# Patient Record
Sex: Female | Born: 1990 | Race: White | Hispanic: No | Marital: Single | Smoking: Never smoker
Health system: Southern US, Community
[De-identification: ages and names within clinical notes are randomized; demographics above are authoritative.]

---

## 2015-12-02 ENCOUNTER — Inpatient Hospital Stay (HOSPITAL_COMMUNITY)
Admission: EM | Admit: 2015-12-02 | Discharge: 2015-12-04 | DRG: 552 | Disposition: A | Discharge status: Home Health | Payer: Self-pay | Attending: General Surgery | Admitting: General Surgery

## 2015-12-02 ENCOUNTER — Emergency Department (HOSPITAL_COMMUNITY): Payer: Self-pay

## 2015-12-02 ENCOUNTER — Encounter (HOSPITAL_COMMUNITY): Payer: Self-pay | Admitting: Emergency Medicine

## 2015-12-02 DIAGNOSIS — R110 Nausea: Secondary | ICD-10-CM | POA: Diagnosis present

## 2015-12-02 DIAGNOSIS — S22009A Unspecified fracture of unspecified thoracic vertebra, initial encounter for closed fracture: Secondary | ICD-10-CM | POA: Diagnosis present

## 2015-12-02 DIAGNOSIS — R519 Headache, unspecified: Secondary | ICD-10-CM | POA: Diagnosis present

## 2015-12-02 DIAGNOSIS — S22061A Stable burst fracture of T7-T8 vertebra, initial encounter for closed fracture: Principal | ICD-10-CM | POA: Diagnosis present

## 2015-12-02 DIAGNOSIS — S22059A Unspecified fracture of T5-T6 vertebra, initial encounter for closed fracture: Secondary | ICD-10-CM | POA: Diagnosis present

## 2015-12-02 DIAGNOSIS — R51 Headache: Secondary | ICD-10-CM

## 2015-12-02 DIAGNOSIS — S22000A Wedge compression fracture of unspecified thoracic vertebra, initial encounter for closed fracture: Secondary | ICD-10-CM

## 2015-12-02 DIAGNOSIS — F410 Panic disorder [episodic paroxysmal anxiety] without agoraphobia: Secondary | ICD-10-CM | POA: Diagnosis present

## 2015-12-02 LAB — COMPREHENSIVE METABOLIC PANEL
ALT: 40 U/L (ref 14–54)
AST: 68 U/L — AB (ref 15–41)
Albumin: 3.6 g/dL (ref 3.5–5.0)
Alkaline Phosphatase: 51 U/L (ref 38–126)
Anion gap: 9 (ref 5–15)
BILIRUBIN TOTAL: 1.1 mg/dL (ref 0.3–1.2)
BUN: 18 mg/dL (ref 6–20)
CO2: 22 mmol/L (ref 22–32)
CREATININE: 0.9 mg/dL (ref 0.44–1.00)
Calcium: 9.4 mg/dL (ref 8.9–10.3)
Chloride: 108 mmol/L (ref 101–111)
Glucose, Bld: 98 mg/dL (ref 65–99)
POTASSIUM: 3.8 mmol/L (ref 3.5–5.1)
Sodium: 139 mmol/L (ref 135–145)
TOTAL PROTEIN: 6.2 g/dL — AB (ref 6.5–8.1)

## 2015-12-02 LAB — POC URINE PREG, ED: Preg Test, Ur: NEGATIVE

## 2015-12-02 LAB — URINALYSIS, ROUTINE W REFLEX MICROSCOPIC
Bilirubin Urine: NEGATIVE
GLUCOSE, UA: NEGATIVE mg/dL
KETONES UR: NEGATIVE mg/dL
LEUKOCYTES UA: NEGATIVE
Nitrite: NEGATIVE
PROTEIN: NEGATIVE mg/dL
Specific Gravity, Urine: 1.013 (ref 1.005–1.030)
pH: 5.5 (ref 5.0–8.0)

## 2015-12-02 LAB — CBC WITH DIFFERENTIAL/PLATELET
BASOS ABS: 0 10*3/uL (ref 0.0–0.1)
Basophils Relative: 0 %
EOS PCT: 1 %
Eosinophils Absolute: 0.1 10*3/uL (ref 0.0–0.7)
HEMATOCRIT: 37.8 % (ref 36.0–46.0)
Hemoglobin: 12.5 g/dL (ref 12.0–15.0)
LYMPHS ABS: 2.2 10*3/uL (ref 0.7–4.0)
LYMPHS PCT: 15 %
MCH: 31.4 pg (ref 26.0–34.0)
MCHC: 33.1 g/dL (ref 30.0–36.0)
MCV: 95 fL (ref 78.0–100.0)
MONO ABS: 0.8 10*3/uL (ref 0.1–1.0)
MONOS PCT: 6 %
NEUTROS ABS: 11.2 10*3/uL — AB (ref 1.7–7.7)
Neutrophils Relative %: 78 %
Platelets: 268 10*3/uL (ref 150–400)
RBC: 3.98 MIL/uL (ref 3.87–5.11)
RDW: 12.2 % (ref 11.5–15.5)
WBC: 14.4 10*3/uL — ABNORMAL HIGH (ref 4.0–10.5)

## 2015-12-02 LAB — LIPASE, BLOOD: LIPASE: 45 U/L (ref 11–51)

## 2015-12-02 LAB — URINE MICROSCOPIC-ADD ON

## 2015-12-02 MED ORDER — ONDANSETRON HCL 4 MG/2ML IJ SOLN
4.0000 mg | Freq: Four times a day (QID) | INTRAMUSCULAR | Status: DC | PRN
  Administered 2015-12-02: 4 mg via INTRAVENOUS
  Filled 2015-12-02: qty 2

## 2015-12-02 MED ORDER — METHOCARBAMOL 1000 MG/10ML IJ SOLN
1000.0000 mg | Freq: Three times a day (TID) | INTRAMUSCULAR | Status: DC
  Administered 2015-12-02 – 2015-12-03 (×3): 1000 mg via INTRAVENOUS
  Filled 2015-12-02 (×5): qty 10

## 2015-12-02 MED ORDER — SODIUM CHLORIDE 0.9 % IV BOLUS (SEPSIS)
1000.0000 mL | Freq: Once | INTRAVENOUS | Status: AC
  Administered 2015-12-02: 1000 mL via INTRAVENOUS

## 2015-12-02 MED ORDER — FENTANYL CITRATE (PF) 100 MCG/2ML IJ SOLN
100.0000 ug | Freq: Once | INTRAMUSCULAR | Status: AC
  Administered 2015-12-02: 100 ug via INTRAVENOUS
  Filled 2015-12-02: qty 2

## 2015-12-02 MED ORDER — HYDROMORPHONE HCL 1 MG/ML IJ SOLN: 1.0000 mg | INTRAMUSCULAR | Status: DC | PRN

## 2015-12-02 MED ORDER — ENOXAPARIN SODIUM 30 MG/0.3ML ~~LOC~~ SOLN
30.0000 mg | Freq: Two times a day (BID) | SUBCUTANEOUS | Status: DC
  Administered 2015-12-02 – 2015-12-04 (×5): 30 mg via SUBCUTANEOUS
  Filled 2015-12-02 (×5): qty 0.3

## 2015-12-02 MED ORDER — DOCUSATE SODIUM 100 MG PO CAPS
100.0000 mg | ORAL_CAPSULE | Freq: Two times a day (BID) | ORAL | Status: DC
  Administered 2015-12-03: 100 mg via ORAL
  Filled 2015-12-02 (×3): qty 1

## 2015-12-02 MED ORDER — HYDROMORPHONE HCL 1 MG/ML IJ SOLN
1.0000 mg | INTRAMUSCULAR | Status: DC | PRN
Start: 2015-12-02 — End: 2015-12-02
  Administered 2015-12-02 (×2): 1 mg via INTRAVENOUS
  Filled 2015-12-02 (×2): qty 1

## 2015-12-02 MED ORDER — HYDROMORPHONE HCL 1 MG/ML IJ SOLN
2.0000 mg | Freq: Once | INTRAMUSCULAR | Status: AC
  Administered 2015-12-02: 2 mg via INTRAVENOUS
  Filled 2015-12-02: qty 2

## 2015-12-02 MED ORDER — IOHEXOL 300 MG/ML  SOLN
100.0000 mL | Freq: Once | INTRAMUSCULAR | Status: AC | PRN
  Administered 2015-12-02: 100 mL via INTRAVENOUS

## 2015-12-02 MED ORDER — OXYCODONE HCL 5 MG PO TABS: 5.0000 mg | ORAL_TABLET | ORAL | Status: DC | PRN

## 2015-12-02 MED ORDER — ONDANSETRON HCL 4 MG PO TABS
4.0000 mg | ORAL_TABLET | Freq: Four times a day (QID) | ORAL | Status: DC | PRN
  Administered 2015-12-03: 4 mg via ORAL
  Filled 2015-12-02: qty 1

## 2015-12-02 MED ORDER — SODIUM CHLORIDE 0.9 % IV SOLN
INTRAVENOUS | Status: DC
  Administered 2015-12-02 (×2): via INTRAVENOUS

## 2015-12-02 MED ORDER — OXYCODONE HCL 5 MG PO TABS
10.0000 mg | ORAL_TABLET | ORAL | Status: DC | PRN
  Administered 2015-12-02 – 2015-12-03 (×4): 10 mg via ORAL
  Filled 2015-12-02 (×4): qty 2

## 2015-12-02 MED ORDER — PROMETHAZINE HCL 25 MG/ML IJ SOLN
12.5000 mg | Freq: Four times a day (QID) | INTRAMUSCULAR | Status: DC | PRN
  Administered 2015-12-02: 12.5 mg via INTRAVENOUS
  Filled 2015-12-02: qty 1

## 2015-12-02 MED ORDER — BISACODYL 10 MG RE SUPP: 10.0000 mg | Freq: Every day | RECTAL | Status: DC | PRN

## 2015-12-02 MED ORDER — MORPHINE SULFATE (PF) 2 MG/ML IV SOLN
1.0000 mg | INTRAVENOUS | Status: DC | PRN
  Administered 2015-12-02 – 2015-12-03 (×2): 1 mg via INTRAVENOUS
  Filled 2015-12-02 (×2): qty 1

## 2015-12-02 MED ORDER — ACETAMINOPHEN 500 MG PO TABS
1000.0000 mg | ORAL_TABLET | Freq: Four times a day (QID) | ORAL | Status: DC | PRN
  Administered 2015-12-03: 1000 mg via ORAL
  Filled 2015-12-02: qty 2

## 2015-12-02 MED ORDER — HYDROMORPHONE HCL 1 MG/ML IJ SOLN
1.0000 mg | Freq: Once | INTRAMUSCULAR | Status: AC
  Administered 2015-12-02: 1 mg via INTRAVENOUS
  Filled 2015-12-02: qty 1

## 2015-12-02 MED ORDER — TETANUS-DIPHTH-ACELL PERTUSSIS 5-2.5-18.5 LF-MCG/0.5 IM SUSP
0.5000 mL | Freq: Once | INTRAMUSCULAR | Status: AC
  Administered 2015-12-02: 0.5 mL via INTRAMUSCULAR
  Filled 2015-12-02: qty 0.5

## 2015-12-02 MED ORDER — METHOCARBAMOL 500 MG PO TABS: 500.0000 mg | ORAL_TABLET | Freq: Three times a day (TID) | ORAL | Status: DC | PRN

## 2015-12-02 MED ORDER — LORAZEPAM 2 MG/ML IJ SOLN
1.0000 mg | Freq: Once | INTRAMUSCULAR | Status: DC
  Filled 2015-12-02: qty 1
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
## 2015-12-02 NOTE — ED Notes (Signed)
Phlebotomy at the bedside  
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
## 2015-12-02 NOTE — ED Notes (Signed)
Called radiology to report patient result for urine preg is back, ready for transport.
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
## 2015-12-02 NOTE — Progress Notes (Signed)
Orthopedic Tech Progress Note Patient Details:  Sydney PeruJodie Mcelveen 09/18/1991 [id number]  Patient ID: Sydney Oneill, female   DOB: 12/02/1990, 25 y.o.   MRN: [id number] Called in bio-tech brace order; spoke with Anderson MaltaStephanie  Carigan Lister 12/02/2015, 9:11 AM

## 2015-12-02 NOTE — ED Notes (Signed)
Technician coming to place TLSO brace, but needing to go get another size.

## 2015-12-02 NOTE — ED Notes (Signed)
Spoke with ortho about TLSO brace, reports he will inquire about it at 9 am when biomed opens.

## 2015-12-02 NOTE — ED Notes (Signed)
Called Dr. Mora Bellmanni in regards to anxiety and nausea. MD allows for 1mg  of ativan.

## 2015-12-02 NOTE — ED Notes (Signed)
Patient is in ct 
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
## 2015-12-03 LAB — BASIC METABOLIC PANEL
ANION GAP: 7 (ref 5–15)
BUN: 8 mg/dL (ref 6–20)
CALCIUM: 8.6 mg/dL — AB (ref 8.9–10.3)
CO2: 24 mmol/L (ref 22–32)
CREATININE: 0.84 mg/dL (ref 0.44–1.00)
Chloride: 109 mmol/L (ref 101–111)
Glucose, Bld: 85 mg/dL (ref 65–99)
Potassium: 3.4 mmol/L — ABNORMAL LOW (ref 3.5–5.1)
SODIUM: 140 mmol/L (ref 135–145)

## 2015-12-03 LAB — CBC
HCT: 35.7 % — ABNORMAL LOW (ref 36.0–46.0)
Hemoglobin: 12 g/dL (ref 12.0–15.0)
MCH: 32.1 pg (ref 26.0–34.0)
MCHC: 33.6 g/dL (ref 30.0–36.0)
MCV: 95.5 fL (ref 78.0–100.0)
PLATELETS: 212 10*3/uL (ref 150–400)
RBC: 3.74 MIL/uL — ABNORMAL LOW (ref 3.87–5.11)
RDW: 12.5 % (ref 11.5–15.5)
WBC: 6.2 10*3/uL (ref 4.0–10.5)

## 2015-12-03 MED ORDER — MORPHINE SULFATE (PF) 2 MG/ML IV SOLN
1.0000 mg | INTRAVENOUS | Status: DC | PRN
  Administered 2015-12-03 – 2015-12-04 (×3): 2 mg via INTRAVENOUS
  Filled 2015-12-03 (×3): qty 1

## 2015-12-03 MED ORDER — METHOCARBAMOL 500 MG PO TABS
1000.0000 mg | ORAL_TABLET | Freq: Three times a day (TID) | ORAL | Status: DC | PRN
Start: 2015-12-03 — End: 2015-12-04
  Administered 2015-12-04: 1000 mg via ORAL
  Filled 2015-12-03: qty 2

## 2015-12-03 MED ORDER — OXYCODONE HCL 5 MG PO TABS
5.0000 mg | ORAL_TABLET | ORAL | Status: DC | PRN
  Administered 2015-12-03: 10 mg via ORAL
  Administered 2015-12-03: 15 mg via ORAL
  Administered 2015-12-03: 5 mg via ORAL
  Administered 2015-12-04 (×2): 10 mg via ORAL
  Administered 2015-12-04 (×2): 15 mg via ORAL
  Filled 2015-12-03 (×2): qty 2
  Filled 2015-12-03: qty 3
  Filled 2015-12-03: qty 1
  Filled 2015-12-03 (×2): qty 2
  Filled 2015-12-03: qty 3
  Filled 2015-12-03: qty 2
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
## 2015-12-03 NOTE — Plan of Care (Signed)
Problem: Acute Rehab PT Goals(only PT should resolve) Goal: Pt Will Go Supine/Side To Sit With correct body mechanics     
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
## 2015-12-04 DIAGNOSIS — R519 Headache, unspecified: Secondary | ICD-10-CM | POA: Diagnosis present

## 2015-12-04 DIAGNOSIS — R51 Headache: Secondary | ICD-10-CM

## 2015-12-04 DIAGNOSIS — R110 Nausea: Secondary | ICD-10-CM | POA: Diagnosis present

## 2015-12-04 MED ORDER — DIAZEPAM 5 MG PO TABS
5.0000 mg | ORAL_TABLET | Freq: Three times a day (TID) | ORAL | Status: DC | PRN
  Administered 2015-12-04: 5 mg via ORAL
  Filled 2015-12-04: qty 1

## 2015-12-04 MED ORDER — ACETAMINOPHEN 500 MG PO TABS: 1000.0000 mg | ORAL_TABLET | Freq: Four times a day (QID) | ORAL | Status: AC | PRN

## 2015-12-04 MED ORDER — TRAMADOL HCL 50 MG PO TABS: 50.0000 mg | ORAL_TABLET | Freq: Four times a day (QID) | ORAL | Status: AC | PRN

## 2015-12-04 MED ORDER — NAPROXEN 250 MG PO TABS: 500.0000 mg | ORAL_TABLET | Freq: Two times a day (BID) | ORAL | Status: DC

## 2015-12-04 MED ORDER — BISACODYL 10 MG RE SUPP: 10.0000 mg | Freq: Every day | RECTAL | Status: AC | PRN

## 2015-12-04 MED ORDER — DOCUSATE SODIUM 100 MG PO CAPS: 100.0000 mg | ORAL_CAPSULE | Freq: Two times a day (BID) | ORAL | Status: AC

## 2015-12-04 MED ORDER — TRAMADOL HCL 50 MG PO TABS
50.0000 mg | ORAL_TABLET | Freq: Four times a day (QID) | ORAL | Status: DC | PRN
  Administered 2015-12-04: 50 mg via ORAL
  Filled 2015-12-04: qty 1

## 2015-12-04 MED ORDER — DIAZEPAM 5 MG PO TABS: 5.0000 mg | ORAL_TABLET | Freq: Three times a day (TID) | ORAL | Status: DC | PRN

## 2015-12-04 MED ORDER — NAPROXEN 500 MG PO TABS: 500.0000 mg | ORAL_TABLET | Freq: Two times a day (BID) | ORAL | Status: AC

## 2015-12-04 MED ORDER — OXYCODONE HCL 5 MG PO TABS: 5.0000 mg | ORAL_TABLET | Freq: Four times a day (QID) | ORAL | Status: AC | PRN

## 2015-12-04 MED ORDER — DIAZEPAM 5 MG PO TABS: 5.0000 mg | ORAL_TABLET | Freq: Three times a day (TID) | ORAL | Status: AC | PRN
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
## 2015-12-04 NOTE — Discharge Instructions (Signed)
Thoracic Vertebral Fracture A vertebral fracture is a break in one of the bones that make up the spine (vertebrae). The vertebrae are stacked on top of each other to form the spinal column. They support the body and protect the spinal cord. The vertebral column has an upper part (cervical spine), a middle part (thoracic spine), and a lower part (lumbar spine). Most vertebral fractures occur in the thoracic spine or lumbar spine. There are three main types of vertebral fractures:  Flexion fracture. This happens when vertebrae collapse. Vertebrae can collapse:  In the front (compression fracture). This type of fracture is common in people who have a condition that causes their bones to be weak and brittle (osteoporosis). The fracture can make a person lose height.  In the front and back (axial burst fracture).  Extension fracture. This happens when an external force pulls apart the vertebrae.  Rotation fracture. This happens when the spine bends extremely in one direction. This type can cause a piece of a vertebra to break off (transverse process fracture) or move out of its normal position (fracture dislocation). This type of fracture has a high risk for spinal cord injury. Vertebral fractures can range from mild to very severe. The most severe types are those that cause the broken bones to move out of place (unstable) and those that injure or press on the spinal cord. CAUSES This condition is usually caused by a forceful injury. This type of injury commonly results from:  Car accidents.  Falling or jumping from a great height.  Collisions in contact sports.  Violent acts, such as an assault or a gunshot wound. RISK FACTORS This injury is more likely to happen to people who:  Have osteoporosis.  Participate in contact sports.  Are in situations that could result in falls or other violent injuries. SYMPTOMS Symptoms of this injury depend on the location and the type of fracture. The  most common symptom is back pain that gets worse with movement. You may also have trouble standing or walking. If a fracture has damaged your spinal cord or is pressing on it, you may also have:  Numbness.  Tingling.  Weakness.  Loss of movement.  Loss of bowel or bladder control. DIAGNOSIS This injury may be diagnosed based on symptoms, medical history, and a physical exam. You may also have imaging tests to confirm the diagnosis. These may include:  Spine X-ray.  CT scan.  MRI. TREATMENT Treatment for this injury depends on the type of fracture. If your fracture is stable and does not affect your spinal cord, it may heal with nonsurgical treatment, such as:  Taking pain medicine.  Wearing a cast or a brace.  Doing physical therapy exercises. If your vertebral fracture is unstable or it affects your spinal cord, you may need surgical treatment, such as:  Laminectomy. This procedure involves removing the part of a vertebra that is pushing on the spinal cord (spinal decompression surgery). Bone fragments may also be removed.  Spinal fusion. This procedure is used to stabilize an unstable fracture. Vertebrae may be joined together with a piece of bone from another part of your body (graft) and held in place with rods, plates, or screws.  Vertebroplasty. In this procedure, bone cement is used to rebuild collapsed vertebrae. HOME CARE INSTRUCTIONS General Instructions  Take medicines only as directed by your health care provider.  Do not drive or operate heavy machinery while taking pain medicine.  If directed, apply ice to the injured area:  Put ice in a plastic bag.  Place a towel between your skin and the bag.  Leave the ice on for 30 minutes every two hours at first. Then apply the ice as needed.  Wear your neck brace or back brace as directed by your health care provider.  Do not drink alcohol. Alcohol can interfere with your treatment.  Keep all follow-up visits  as directed by your health care provider. This is important. It can help to prevent permanent injury, disability, and long-lasting (chronic) pain. Activity  Stay in bed (on bed rest) only as directed by your health care provider. Being on bed rest for too long can make your condition worse.  Return to your normal activities as directed by your health care provider. Ask what activities are safe for you.  Do exercises to improve motion and strength in your back (physical therapy), as recommended by your health care provider.   Exercise regularly as directed by your health care provider. SEEK MEDICAL CARE IF:  You have a fever.  You develop a cough that makes your pain worse.  Your pain medicine is not helping.  Your pain does not get better over time.  You cannot return to your normal activities as planned or expected. SEEK IMMEDIATE MEDICAL CARE IF:  Your pain is very bad and it suddenly gets worse.  You are unable to move any body part (paralysis) that is below the level of your injury.  You have numbness, tingling, or weakness in any body part that is below the level of your injury.  You cannot control your bladder or bowels.   This information is not intended to replace advice given to you by your health care provider. Make sure you discuss any questions you have with your health care provider.   Document Released: 10/14/2004 Document Revised: 01/21/2015 Document Reviewed: 09/11/2014 Elsevier Interactive Patient Education 2016 [employer].   Concussion, Adult A concussion, or closed-head injury, is a brain injury caused by a direct blow to the head or by a quick and sudden movement (jolt) of the head or neck. Concussions are usually not life-threatening. Even so, the effects of a concussion can be serious. If you have had a concussion before, you are more likely to experience concussion-like symptoms after a direct blow to the head.  CAUSES  Direct blow to the head, such  as from running into another player during a soccer game, being hit in a fight, or hitting your head on a hard surface.  A jolt of the head or neck that causes the brain to move back and forth inside the skull, such as in a car crash. SIGNS AND SYMPTOMS The signs of a concussion can be hard to notice. Early on, they may be missed by you, family members, and health care providers. You may look fine but act or feel differently. Symptoms are usually temporary, but they may last for days, weeks, or even longer. Some symptoms may appear right away while others may not show up for hours or days. Every head injury is different. Symptoms include:  Mild to moderate headaches that will not go away.  A feeling of pressure inside your head.  Having more trouble than usual:  Learning or remembering things you have heard.  Answering questions.  Paying attention or concentrating.  Organizing daily tasks.  Making decisions and solving problems.  Slowness in thinking, acting or reacting, speaking, or reading.  Getting lost or being easily confused.  Feeling tired all the  time or lacking energy (fatigued).  Feeling drowsy.  Sleep disturbances.  Sleeping more than usual.  Sleeping less than usual.  Trouble falling asleep.  Trouble sleeping (insomnia).  Loss of balance or feeling lightheaded or dizzy.  Nausea or vomiting.  Numbness or tingling.  Increased sensitivity to:  Sounds.  Lights.  Distractions.  Vision problems or eyes that tire easily.  Diminished sense of taste or smell.  Ringing in the ears.  Mood changes such as feeling sad or anxious.  Becoming easily irritated or angry for little or no reason.  Lack of motivation.  Seeing or hearing things other people do not see or hear (hallucinations). DIAGNOSIS Your health care provider can usually diagnose a concussion based on a description of your injury and symptoms. He or she will ask whether you passed out  (lost consciousness) and whether you are having trouble remembering events that happened right before and during your injury. Your evaluation might include:  A brain scan to look for signs of injury to the brain. Even if the test shows no injury, you may still have a concussion.  Blood tests to be sure other problems are not present. TREATMENT  Concussions are usually treated in an emergency department, in urgent care, or at a clinic. You may need to stay in the hospital overnight for further treatment.  Tell your health care provider if you are taking any medicines, including prescription medicines, over-the-counter medicines, and natural remedies. Some medicines, such as blood thinners (anticoagulants) and aspirin, may increase the chance of complications. Also tell your health care provider whether you have had alcohol or are taking illegal drugs. This information may affect treatment.  Your health care provider will send you home with important instructions to follow.  How fast you will recover from a concussion depends on many factors. These factors include how severe your concussion is, what part of your brain was injured, your age, and how healthy you were before the concussion.  Most people with mild injuries recover fully. Recovery can take time. In general, recovery is slower in older persons. Also, persons who have had a concussion in the past or have other medical problems may find that it takes longer to recover from their current injury. HOME CARE INSTRUCTIONS General Instructions  Carefully follow the directions your health care provider gave you.  Only take over-the-counter or prescription medicines for pain, discomfort, or fever as directed by your health care provider.  Take only those medicines that your health care provider has approved.  Do not drink alcohol until your health care provider says you are well enough to do so. Alcohol and certain other drugs may slow your  recovery and can put you at risk of further injury.  If it is harder than usual to remember things, write them down.  If you are easily distracted, try to do one thing at a time. For example, do not try to watch TV while fixing dinner.  Talk with family members or close friends when making important decisions.  Keep all follow-up appointments. Repeated evaluation of your symptoms is recommended for your recovery.  Watch your symptoms and tell others to do the same. Complications sometimes occur after a concussion. Older adults with a brain injury may have a higher risk of serious complications, such as a blood clot on the brain.  Tell your teachers, school nurse, school counselor, coach, athletic trainer, or work Production designer, theatre/television/filmmanager about your injury, symptoms, and restrictions. Tell them about what you can or  cannot do. They should watch for:  Increased problems with attention or concentration.  Increased difficulty remembering or learning new information.  Increased time needed to complete tasks or assignments.  Increased irritability or decreased ability to cope with stress.  Increased symptoms.  Rest. Rest helps the brain to heal. Make sure you:  Get plenty of sleep at night. Avoid staying up late at night.  Keep the same bedtime hours on weekends and weekdays.  Rest during the day. Take daytime naps or rest breaks when you feel tired.  Limit activities that require a lot of thought or concentration. These include:  Doing homework or job-related work.  Watching TV.  Working on the computer.  Avoid any situation where there is potential for another head injury (football, hockey, soccer, basketball, martial arts, downhill snow sports and horseback riding). Your condition will get worse every time you experience a concussion. You should avoid these activities until you are evaluated by the appropriate follow-up health care providers. Returning To Your Regular Activities You will need  to return to your normal activities slowly, not all at once. You must give your body and brain enough time for recovery.  Do not return to sports or other athletic activities until your health care provider tells you it is safe to do so.  Ask your health care provider when you can drive, ride a bicycle, or operate heavy machinery. Your ability to react may be slower after a brain injury. Never do these activities if you are dizzy.  Ask your health care provider about when you can return to work or school. Preventing Another Concussion It is very important to avoid another brain injury, especially before you have recovered. In rare cases, another injury can lead to permanent brain damage, brain swelling, or death. The risk of this is greatest during the first 7-10 days after a head injury. Avoid injuries by:  Wearing a seat belt when riding in a car.  Drinking alcohol only in moderation.  Wearing a helmet when biking, skiing, skateboarding, skating, or doing similar activities.  Avoiding activities that could lead to a second concussion, such as contact or recreational sports, until your health care provider says it is okay.  Taking safety measures in your home.  Remove clutter and tripping hazards from floors and stairways.  Use grab bars in bathrooms and handrails by stairs.  Place non-slip mats on floors and in bathtubs.  Improve lighting in dim areas. SEEK MEDICAL CARE IF:  You have increased problems paying attention or concentrating.  You have increased difficulty remembering or learning new information.  You need more time to complete tasks or assignments than before.  You have increased irritability or decreased ability to cope with stress.  You have more symptoms than before. Seek medical care if you have any of the following symptoms for more than 2 weeks after your injury:  Lasting (chronic) headaches.  Dizziness or balance problems.  Nausea.  Vision  problems.  Increased sensitivity to noise or light.  Depression or mood swings.  Anxiety or irritability.  Memory problems.  Difficulty concentrating or paying attention.  Sleep problems.  Feeling tired all the time. SEEK IMMEDIATE MEDICAL CARE IF:  You have severe or worsening headaches. These may be a sign of a blood clot in the brain.  You have weakness (even if only in one hand, leg, or part of the face).  You have numbness.  You have decreased coordination.  You vomit repeatedly.  You have increased  sleepiness.  One pupil is larger than the other.  You have convulsions.  You have slurred speech.  You have increased confusion. This may be a sign of a blood clot in the brain.  You have increased restlessness, agitation, or irritability.  You are unable to recognize people or places.  You have neck pain.  It is difficult to wake you up.  You have unusual behavior changes.  You lose consciousness. MAKE SURE YOU:  Understand these instructions.  Will watch your condition.  Will get help right away if you are not doing well or get worse.   This information is not intended to replace advice given to you by your health care provider. Make sure you discuss any questions you have with your health care provider.   Document Released: 11/27/2003 Document Revised: 09/27/2014 Document Reviewed: 03/29/2013 Elsevier Interactive Patient Education [employer]

## 2015-12-04 NOTE — Progress Notes (Signed)
Sydney PeruJodie Oneill to be D/C'd to home per MD order.  Discussed with the patient and all questions fully answered.  VSS, Skin clean, dry and intact without evidence of skin break down, no evidence of skin tears noted. IV catheter discontinued intact. Site without signs and symptoms of complications. Dressing and pressure applied.  An After Visit Summary was printed and given to the patient. Patient received prescriptions.  D/c education completed with patient/family including follow up instructions, medication list, d/c activities limitations if indicated, with other d/c instructions as indicated by MD - patient able to verbalize understanding, all questions fully answered.   Patient instructed to return to ED, call 911, or call MD for any changes in condition.   Patient escorted via WC, and D/C home via private auto.  [name] 12/04/2015 4:51 PM
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
## 2015-12-11 ENCOUNTER — Telehealth (HOSPITAL_COMMUNITY): Payer: Self-pay
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
